# Patient Record
Sex: Male | Born: 1995 | Race: White | Hispanic: No | Marital: Single
Health system: Southern US, Community
[De-identification: ages and names within clinical notes are randomized; demographics above are authoritative.]

---

## 2013-10-26 ENCOUNTER — Observation Stay: Payer: Self-pay | Admitting: Surgery

## 2013-10-26 LAB — CBC
HCT: 45.8 % (ref 40.0–52.0)
HGB: 15.7 g/dL (ref 13.0–18.0)
MCH: 32.2 pg (ref 26.0–34.0)
MCHC: 34.3 g/dL (ref 32.0–36.0)
MCV: 94 fL (ref 80–100)
PLATELETS: 240 10*3/uL (ref 150–440)
RBC: 4.87 10*6/uL (ref 4.40–5.90)
RDW: 12.9 % (ref 11.5–14.5)
WBC: 8.8 10*3/uL (ref 3.8–10.6)

## 2013-10-26 LAB — BASIC METABOLIC PANEL
Anion Gap: 3 — ABNORMAL LOW (ref 7–16)
BUN: 17 mg/dL (ref 7–18)
CALCIUM: 9.5 mg/dL (ref 9.0–10.7)
Chloride: 103 mmol/L (ref 98–107)
Co2: 31 mmol/L (ref 21–32)
Creatinine: 1.26 mg/dL (ref 0.60–1.30)
GLUCOSE: 95 mg/dL (ref 65–99)
OSMOLALITY: 275 (ref 275–301)
Potassium: 3.7 mmol/L (ref 3.5–5.1)
SODIUM: 137 mmol/L (ref 136–145)

## 2014-06-09 IMAGING — CT CT ANGIO CHEST
2 of 6 series · 18 of 36 positions shown · IV contrast (APPLIED)
Comparison: Portable chest obtained earlier today.

CLINICAL DATA: Shortness of breath. No breath sounds on the left.
Left chest pain. Similar symptoms with a pneumothorax requiring
pleurodesis in 1664. Recent flight from [place].

EXAM:
CT ANGIOGRAPHY CHEST WITH CONTRAST
TECHNIQUE: Multidetector CT imaging of the chest was performed using the
standard protocol during bolus administration of intravenous
contrast. Multiplanar CT image reconstructions including MIPs were
obtained to evaluate the vascular anatomy.
CONTRAST:  100 cc Isovue 370

[Series 5: pe 1.0 thins · axial · 0.68mm/px · z∈[-326,-48]mm · 17 of 314 slices shown]
[im 18/314  lung]
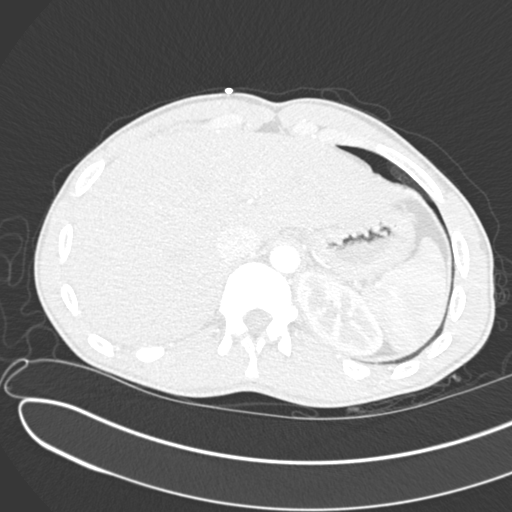
[im 35/314  mediastinal]
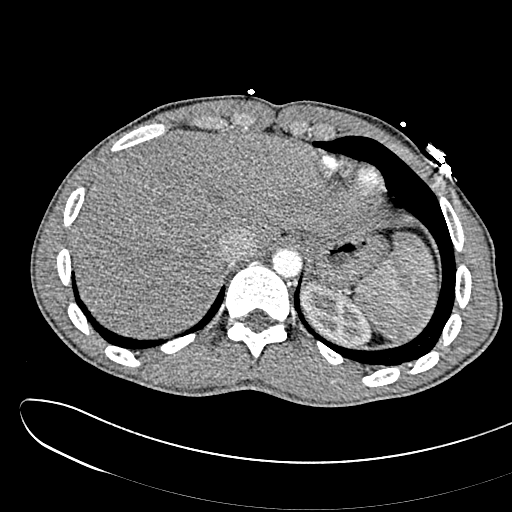
[im 53/314  lung]
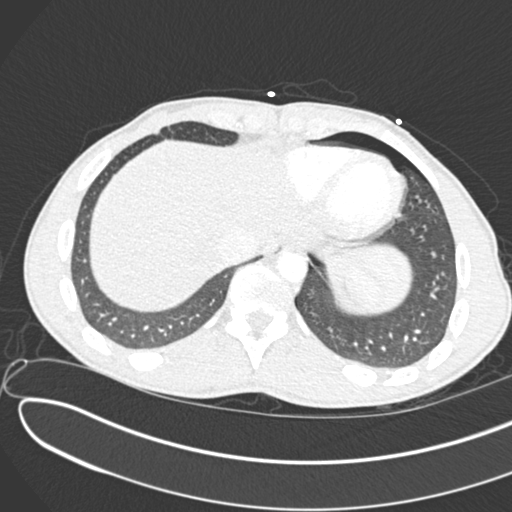
[im 70/314  mediastinal]
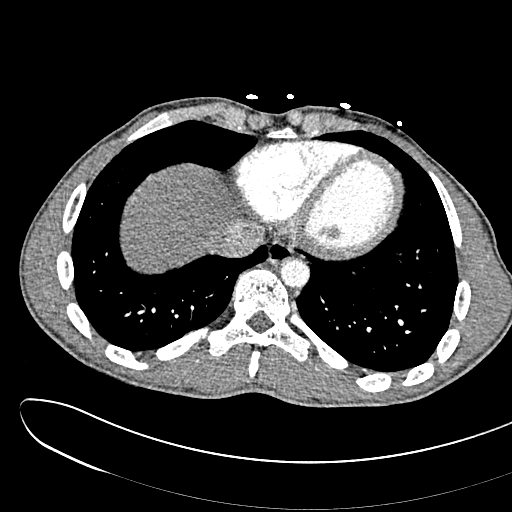
[im 87/314  lung]
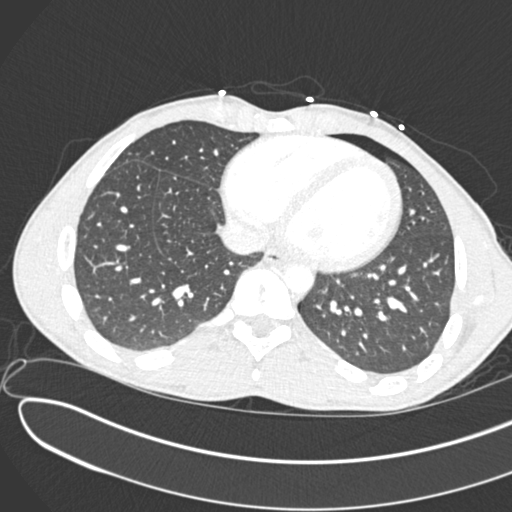
[im 105/314  mediastinal]
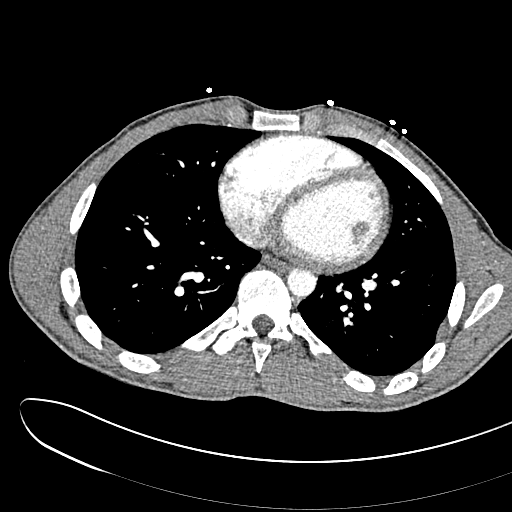
[im 122/314  lung]
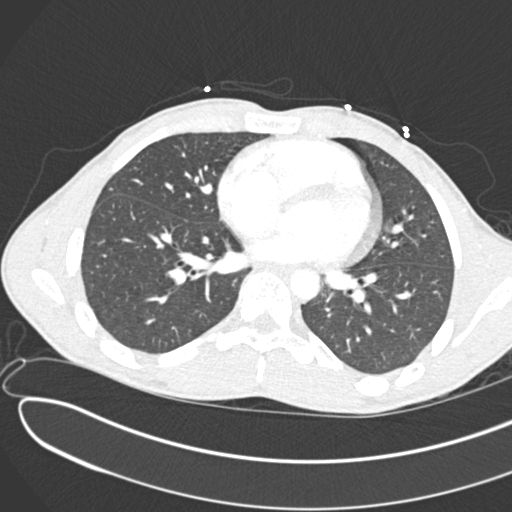
[im 140/314  mediastinal]
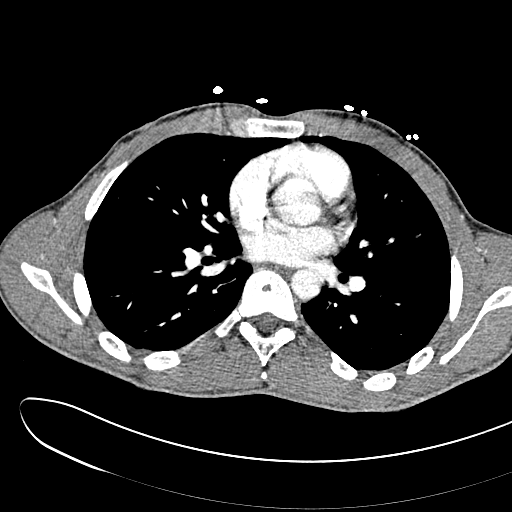
[im 157/314  lung]
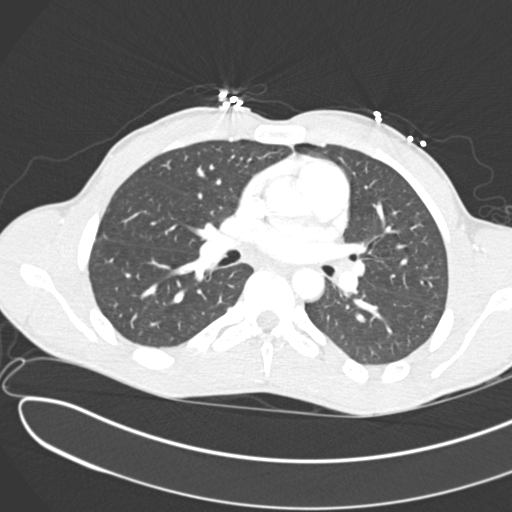
[im 174/314  mediastinal]
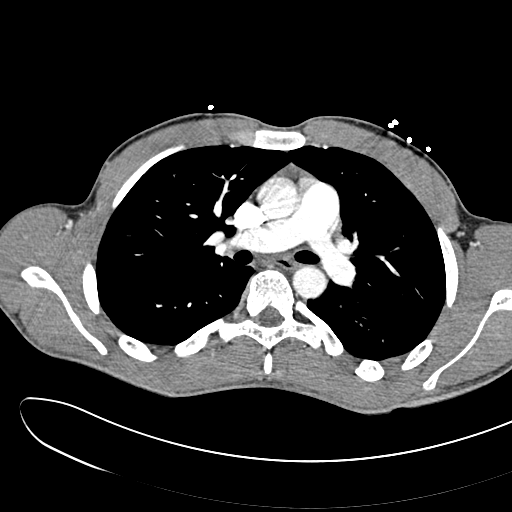
[im 192/314  lung]
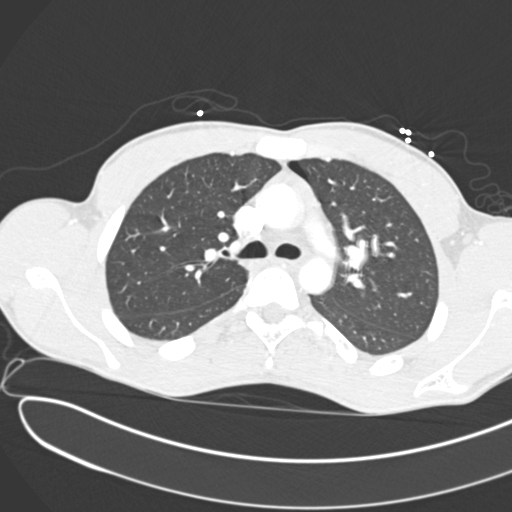
[im 209/314  mediastinal]
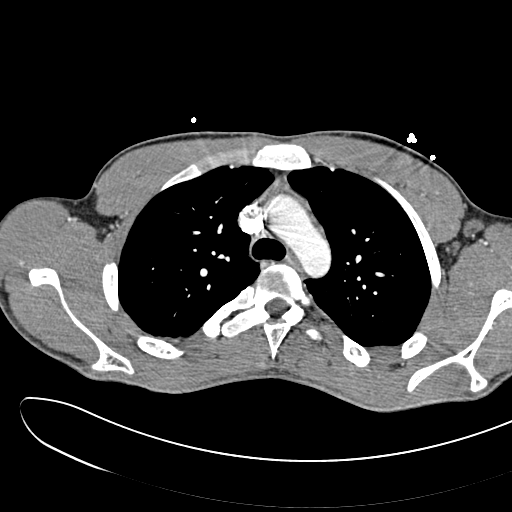
[im 227/314  lung]
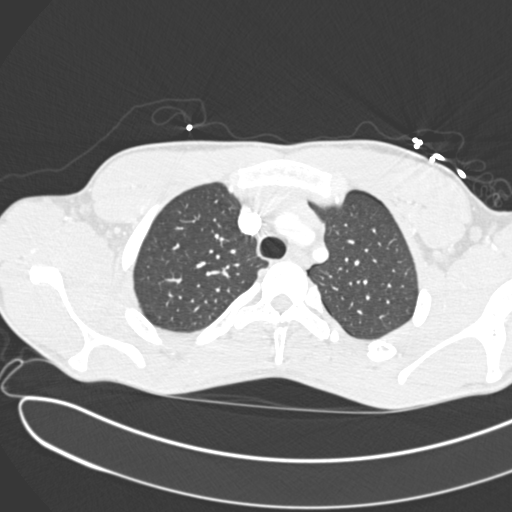
[im 244/314  mediastinal]
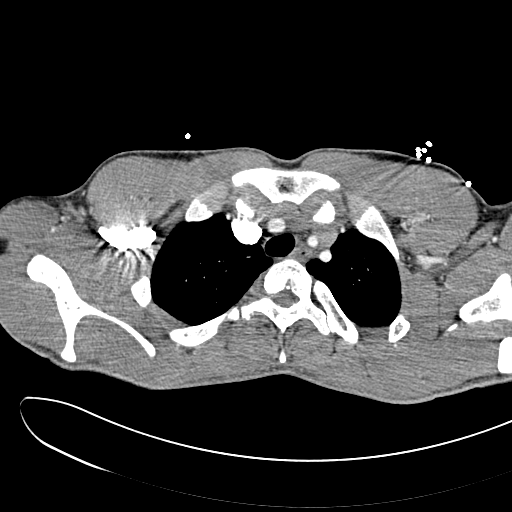
[im 261/314  lung]
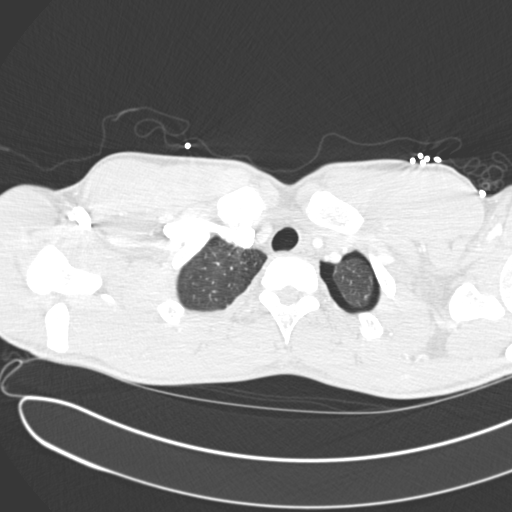
[im 279/314  mediastinal]
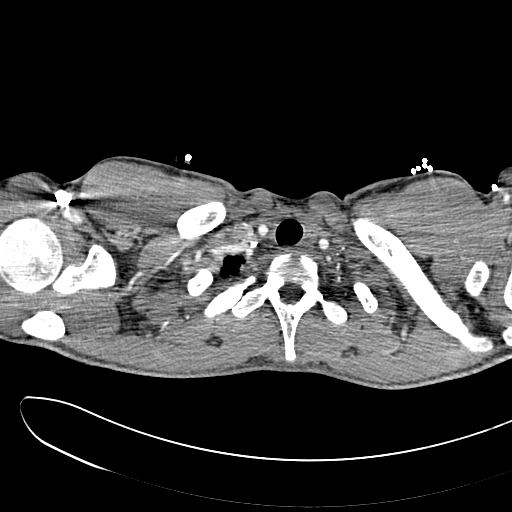
[im 296/314  lung]
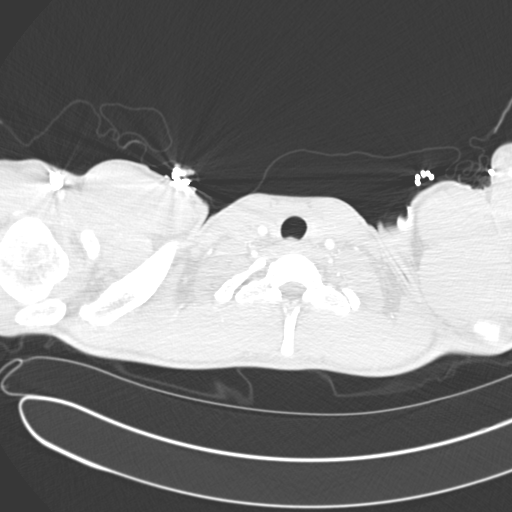

[Series 7: cor pe 2.0 mpr · coronal · 0.64mm/px · 1 of 99 slices shown]
[im 50/99  mediastinal]
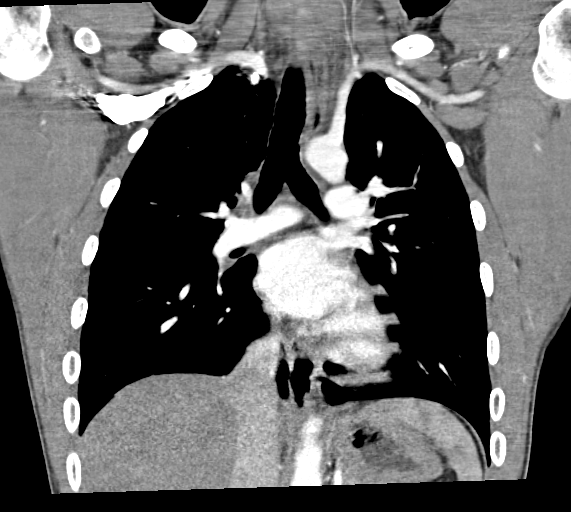

[18 of 36 positions shown; findings below may reference images not displayed]

FINDINGS: Normally opacified pulmonary arteries with no pulmonary arterial
filling defects. Approximately 8 5% left pneumothorax. No
mediastinal shift. No lung nodules or enlarged lymph nodes. Normal
appearing bones and upper abdomen.

Review of the MIP images confirms the above findings.
IMPRESSION: 1. Approximately 5% left pneumothorax.
2. No pulmonary emboli.
Critical Value/emergent results were called by telephone at the time
of interpretation on 10/26/2013 at [DATE] to Dr. DREY JIM , who
verbally acknowledged these results.

## 2014-06-10 IMAGING — CR DG CHEST 2V
1 series · 2 of 2 positions shown · non-contrast
Comparison: 10/26/2013

CLINICAL DATA: Left pneumothorax

EXAM:
CHEST  2 VIEW

[Series 1: w chest pa · 0.14mm/px · 2 of 2 slices shown]
[im 1/2]
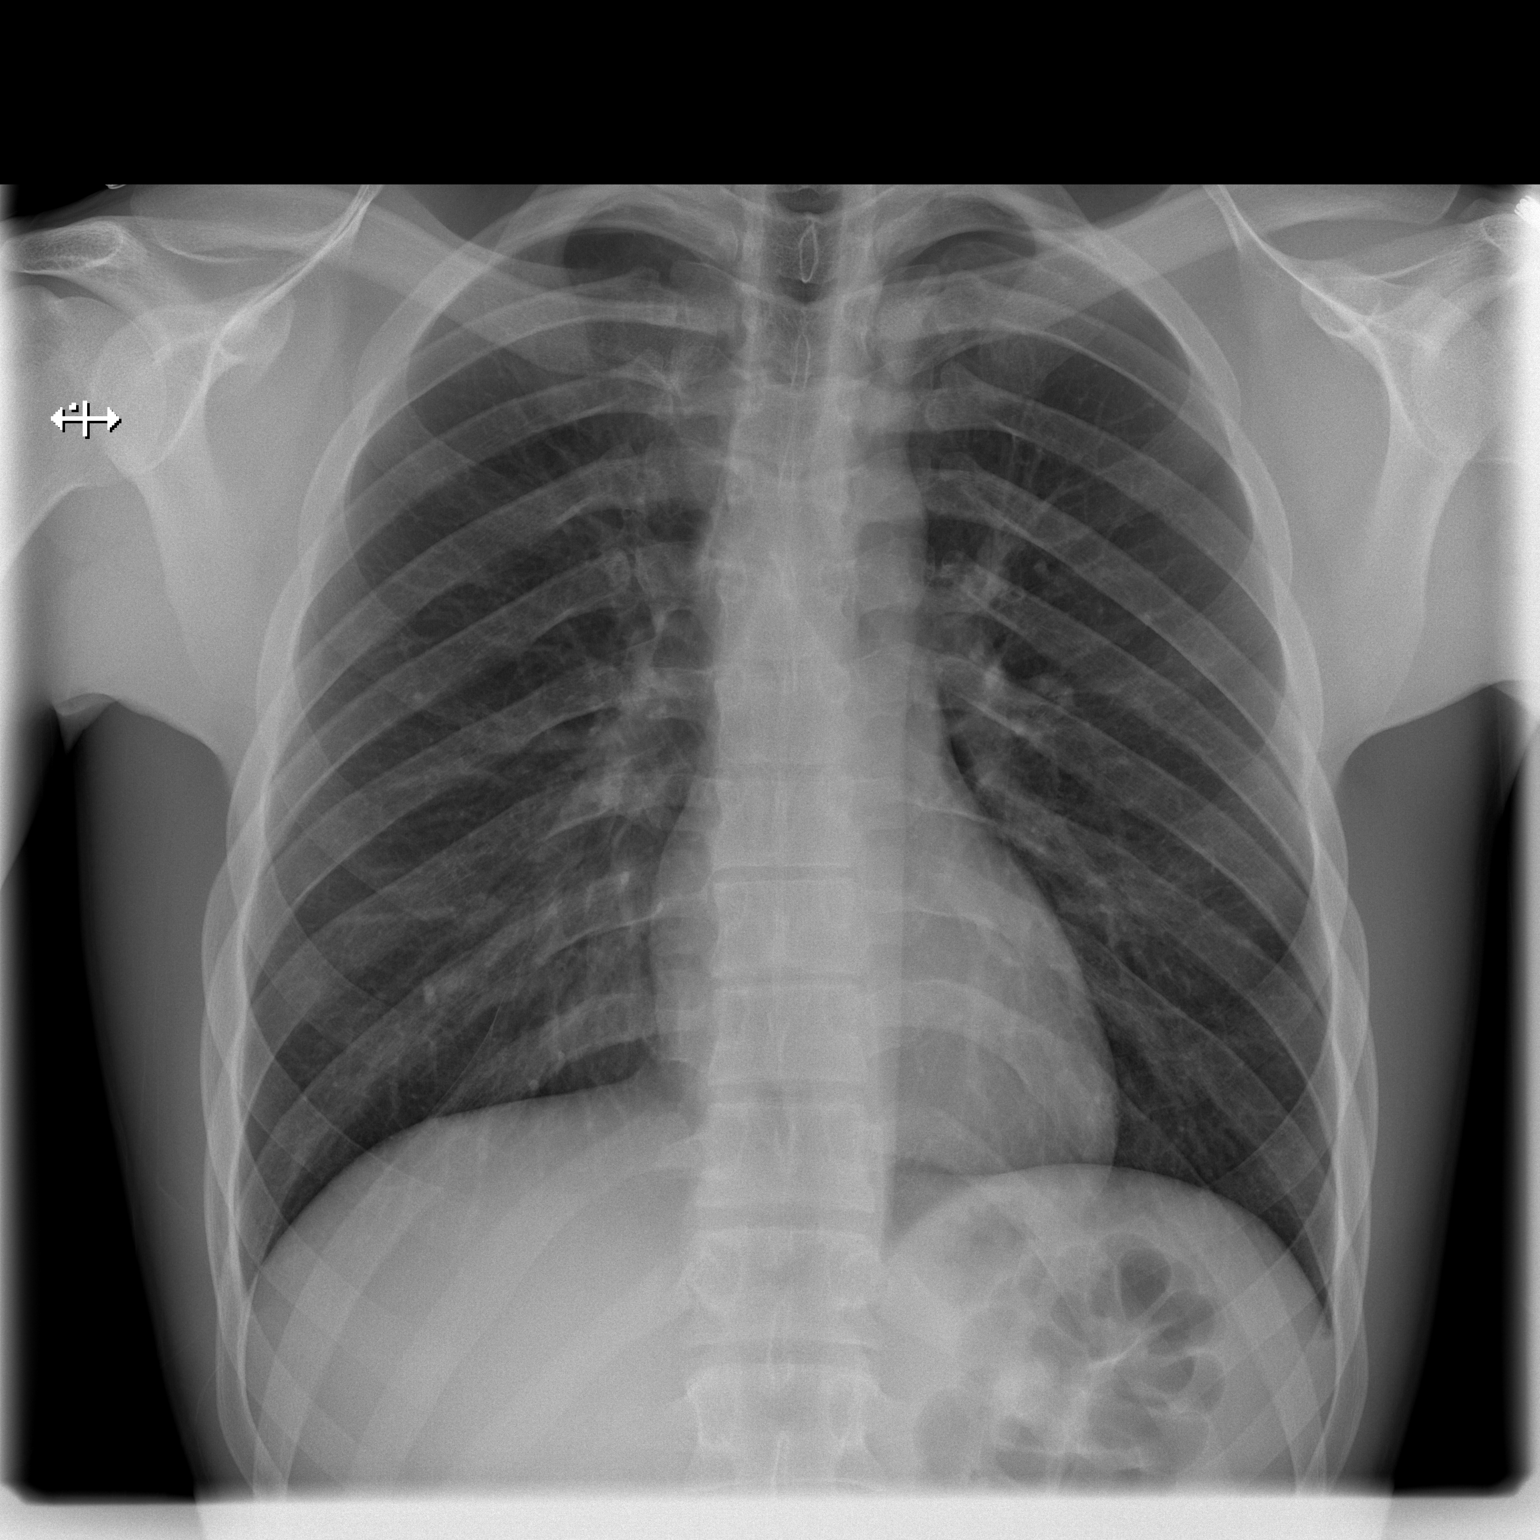
[im 2/2]
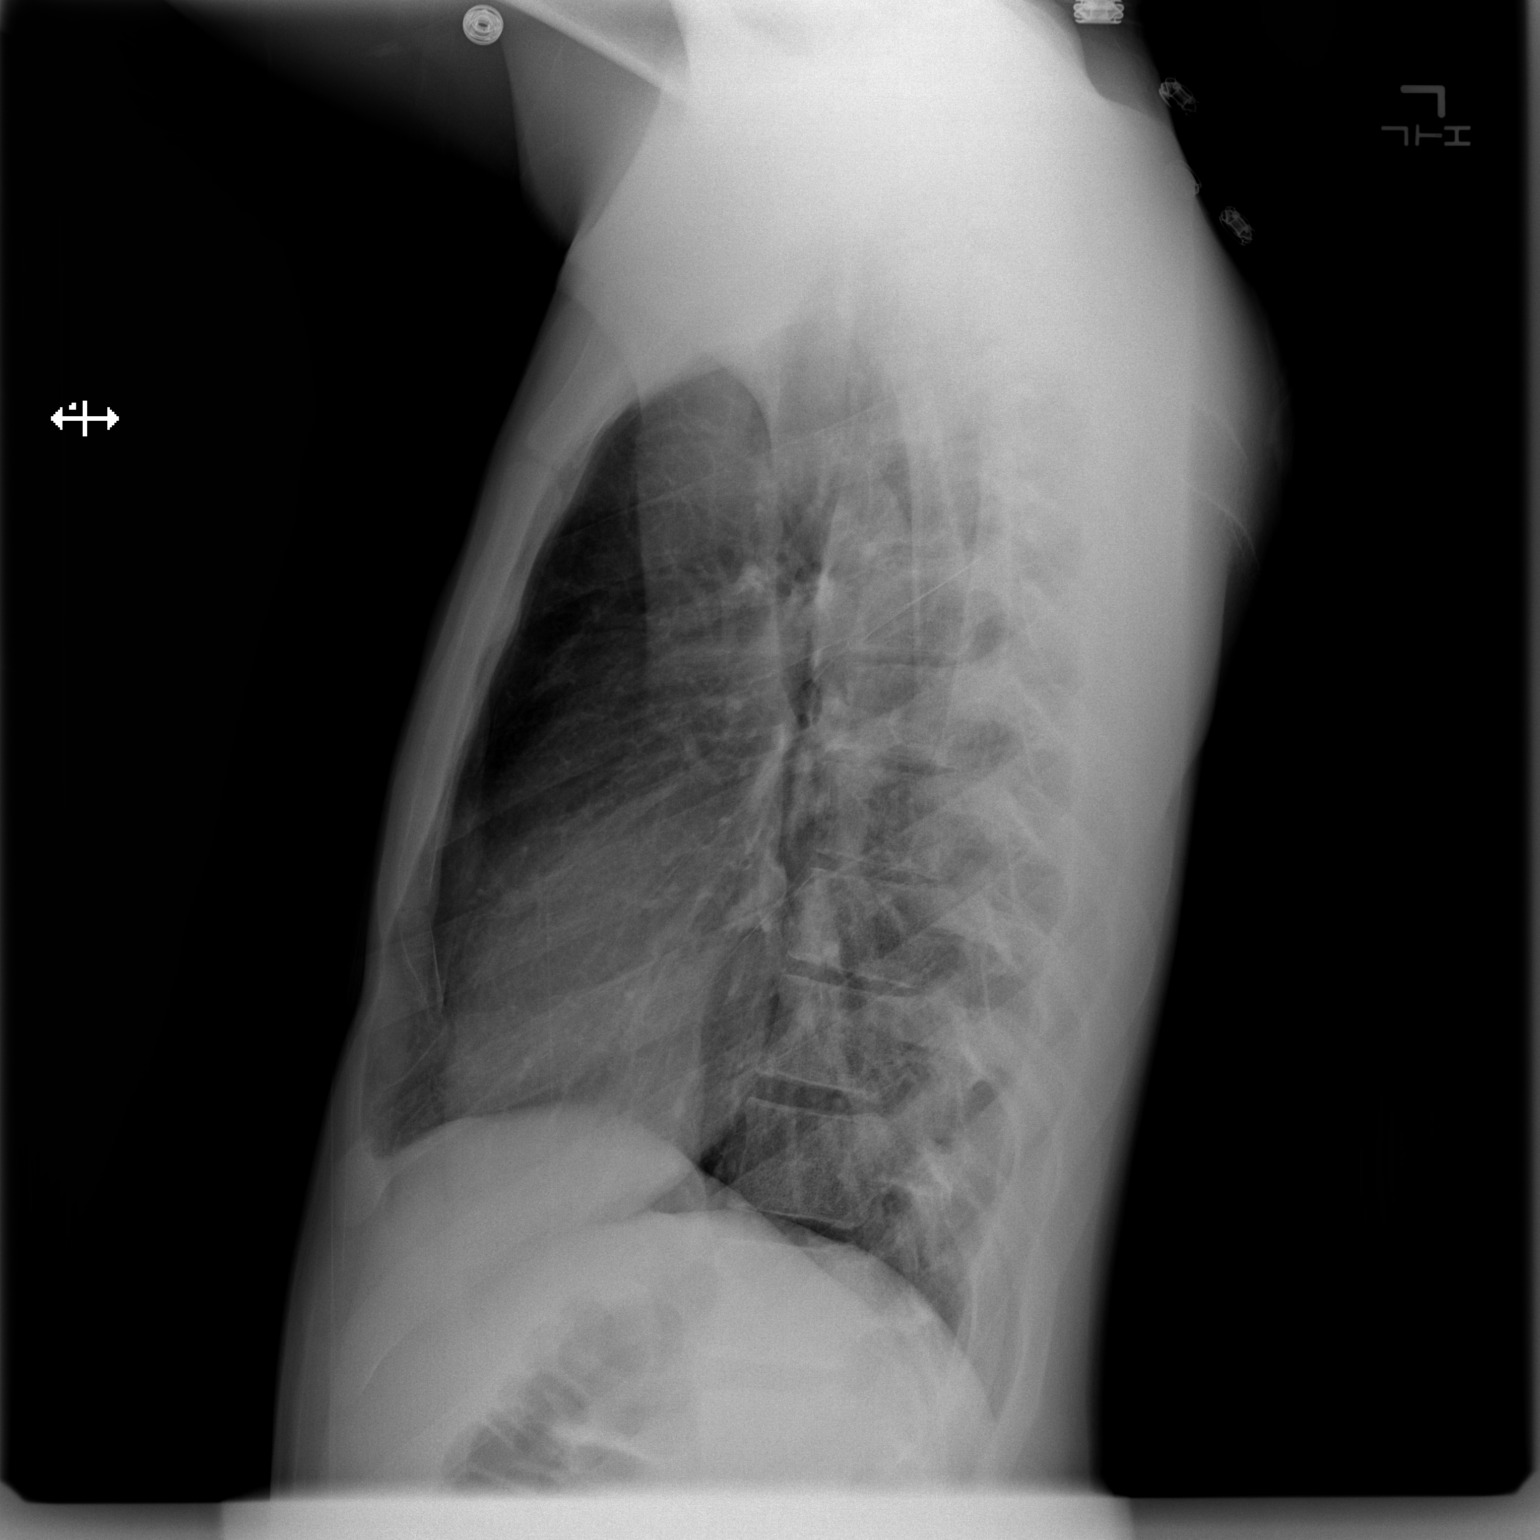

[2 of 2 positions shown; findings below may reference images not displayed]

FINDINGS: A tiny left apical pneumothorax is seen. The lungs are otherwise
within normal limits. The cardiac shadow is stable. No bony
abnormality is seen.
IMPRESSION: Stable left apical pneumothorax.

## 2015-01-11 NOTE — H&P (Signed)
PATIENT NAME:  Timothy Crawford, Timothy MR#:  [id number] DATE OF BIRTH:  1995-12-29  DATE OF ADMISSION:  10/26/2013  ATTENDING PHYSICIAN: Ida Roguehristopher Jeffery Gammell, MD  REASON FOR ADMISSION: Left-sided pneumothorax.   HISTORY OF PRESENT ILLNESS: Mr. Timothy Crawford is a pleasant 19 year old male with a history of right-sided pneumothorax for which he underwent a tube thoracostomy and pleurodesis at [facility] in 2012. According to him, he was bumping chests with his friend earlier and developed sharp left-sided chest pain he says felt identical to the previous pneumothorax. According to him, is additional pneumothorax.   He presented to the Emergency Room and the full extent of the pneumothorax was not immediately apparent until approximately a week later. Because of this, he underwent a CT scan which showed a small 5% left-sided pneumothorax after none was seen on chest x-ray; otherwise, he has been doing fine. No fevers, chills, night sweats, cough, abdominal pain, nausea, vomiting, diarrhea, constipation, dysuria or hematuria.   PAST MEDICAL HISTORY: Pneumothorax on right status post pleurodesis.    HOME MEDICATIONS: None.  ALLERGIES: AZITHROMYCIN WHICH CAUSES ITCHING.   SOCIAL HISTORY: Denies tobacco or alcohol use. 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   FAMILY HISTORY: Unremarkable.   PHYSICAL EXAMINATION:  VITAL SIGNS: Temperature 97.4, pulse 60, blood pressure 144/70, respirations 18, 100% on room air.  GENERAL: No acute distress. Alert and oriented x3.  HEAD: Normocephalic, atraumatic.  EYES: No scleral icterus. No conjunctivitis.  FACE: No obvious facial trauma. Normal external nose. Normal external ears.  CHEST: Lungs clear to auscultation. Moving air well.  HEART: Regular rate and rhythm. No murmurs, rubs, or gallops.  ABDOMEN: Soft, nontender, nondistended.  EXTREMITIES: Moves all extremities well. Strength five out of five.  NEUROLOGIC: Cranial nerves II through XII grossly intact.    LABORATORY DATA: Unremarkable.   IMAGING: Chest x-ray shows no visible pneumothorax.   CT shows small 5% left-sided anterior pneumothorax.   ASSESSMENT AND PLAN: Mr. Timothy Crawford is a pleasant 19 year old who presents with a spontaneous small pneumothorax after a previous history of pneumothorax. He will be admitted for observation and serial chest x-rays. If it enlarges, will place a chest tube. I explained this to him and his parents and they are in agreement with this plan.   ____________________________ Si Raiderhristopher A. Masae Lukacs, MD cal:np D: 10/26/2013 22:07:32 ET T: 10/26/2013 22:52:32 ET JOB#: [id number]  cc: Cristal Deerhristopher A. Mariona Scholes, MD, <Dictator> Jarvis NewcomerHRISTOPHER A Chrisy Hillebrand MD ELECTRONICALLY SIGNED 10/28/2013 23:54
